# Patient Record
Sex: Male | Born: 1985 | Race: Black or African American | Hispanic: No | Marital: Single
Health system: Southern US, Community
[De-identification: ages and names within clinical notes are randomized; demographics above are authoritative.]

---
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
## 2005-11-10 ENCOUNTER — Emergency Department (HOSPITAL_COMMUNITY): Admission: EM | Admit: 2005-11-10 | Discharge: 2005-11-10 | Payer: Self-pay | Admitting: Emergency Medicine

## 2006-06-27 ENCOUNTER — Emergency Department (HOSPITAL_COMMUNITY): Admission: EM | Admit: 2006-06-27 | Discharge: 2006-06-27 | Payer: Self-pay | Admitting: Emergency Medicine

## 2006-08-20 ENCOUNTER — Emergency Department (HOSPITAL_COMMUNITY): Admission: EM | Admit: 2006-08-20 | Discharge: 2006-08-20 | Payer: Self-pay | Admitting: Emergency Medicine

## 2008-10-15 ENCOUNTER — Emergency Department (HOSPITAL_COMMUNITY): Admission: EM | Admit: 2008-10-15 | Discharge: 2008-10-15 | Payer: Self-pay | Admitting: Emergency Medicine

## 2009-05-06 ENCOUNTER — Emergency Department (HOSPITAL_COMMUNITY): Admission: EM | Admit: 2009-05-06 | Discharge: 2009-05-07 | Payer: Self-pay | Admitting: Emergency Medicine

## 2010-09-30 LAB — CBC
HCT: 45.1 % (ref 39.0–52.0)
Platelets: 164 10*3/uL (ref 150–400)
RDW: 13.6 % (ref 11.5–15.5)

## 2010-09-30 LAB — POCT CARDIAC MARKERS
CKMB, poc: 1.2 ng/mL (ref 1.0–8.0)
Myoglobin, poc: 158 ng/mL (ref 12–200)

## 2010-12-31 ENCOUNTER — Inpatient Hospital Stay (INDEPENDENT_AMBULATORY_CARE_PROVIDER_SITE_OTHER)
Admission: RE | Admit: 2010-12-31 | Discharge: 2010-12-31 | Disposition: A | Discharge status: Home / Self Care | Payer: BC Managed Care – PPO | Source: Ambulatory Visit | Attending: Family Medicine | Admitting: Family Medicine

## 2010-12-31 DIAGNOSIS — T6391XA Toxic effect of contact with unspecified venomous animal, accidental (unintentional), initial encounter: Secondary | ICD-10-CM

## 2011-04-24 ENCOUNTER — Emergency Department (HOSPITAL_COMMUNITY)
Admission: EM | Admit: 2011-04-24 | Discharge: 2011-04-24 | Disposition: A | Discharge status: Home / Self Care | Payer: BC Managed Care – PPO | Attending: Emergency Medicine | Admitting: Emergency Medicine

## 2011-04-24 DIAGNOSIS — R51 Headache: Secondary | ICD-10-CM | POA: Insufficient documentation

## 2011-04-24 DIAGNOSIS — E876 Hypokalemia: Secondary | ICD-10-CM | POA: Insufficient documentation

## 2011-04-24 LAB — DIFFERENTIAL
Basophils Relative: 0 % (ref 0–1)
Monocytes Absolute: 0.3 10*3/uL (ref 0.1–1.0)
Monocytes Relative: 7 % (ref 3–12)
Neutro Abs: 2.3 10*3/uL (ref 1.7–7.7)

## 2011-04-24 LAB — BASIC METABOLIC PANEL
BUN: 11 mg/dL (ref 6–23)
Calcium: 9.4 mg/dL (ref 8.4–10.5)
GFR calc non Af Amer: >90 mL/min (ref >90)
Glucose, Bld: 100 mg/dL — ABNORMAL HIGH (ref 70–99)
Sodium: 137 mEq/L (ref 135–145)

## 2011-04-24 LAB — CBC
Hemoglobin: 16.1 g/dL (ref 13.0–17.0)
MCH: 30.1 pg (ref 26.0–34.0)
MCHC: 36.3 g/dL — ABNORMAL HIGH (ref 30.0–36.0)

## 2019-09-28 ENCOUNTER — Emergency Department (HOSPITAL_COMMUNITY): Payer: BC Managed Care – PPO

## 2019-09-28 ENCOUNTER — Other Ambulatory Visit: Payer: Self-pay

## 2019-09-28 ENCOUNTER — Emergency Department (HOSPITAL_COMMUNITY)
Admission: EM | Admit: 2019-09-28 | Discharge: 2019-09-28 | Disposition: A | Discharge status: Home / Self Care | Payer: BC Managed Care – PPO | Attending: Emergency Medicine | Admitting: Emergency Medicine

## 2019-09-28 DIAGNOSIS — R519 Headache, unspecified: Secondary | ICD-10-CM | POA: Diagnosis present

## 2019-09-28 LAB — I-STAT CHEM 8, ED
BUN: 12 mg/dL (ref 6–20)
Calcium, Ion: 1.19 mmol/L (ref 1.15–1.40)
Chloride: 104 mmol/L (ref 98–111)
Creatinine, Ser: 1.1 mg/dL (ref 0.61–1.24)
Glucose, Bld: 277 mg/dL — ABNORMAL HIGH (ref 70–99)
HCT: 43 % (ref 39.0–52.0)
Hemoglobin: 14.6 g/dL (ref 13.0–17.0)
Potassium: 3.7 mmol/L (ref 3.5–5.1)
Sodium: 134 mmol/L — ABNORMAL LOW (ref 135–145)
TCO2: 26 mmol/L (ref 22–32)

## 2019-09-28 LAB — CBC WITH DIFFERENTIAL/PLATELET
Abs Immature Granulocytes: 0.01 10*3/uL (ref 0.00–0.07)
Basophils Absolute: 0 10*3/uL (ref 0.0–0.1)
Basophils Relative: 0 %
Eosinophils Absolute: 0.1 10*3/uL (ref 0.0–0.5)
Eosinophils Relative: 2 %
HCT: 41.8 % (ref 39.0–52.0)
Hemoglobin: 14.7 g/dL (ref 13.0–17.0)
Immature Granulocytes: 0 %
Lymphocytes Relative: 34 %
Lymphs Abs: 2.1 10*3/uL (ref 0.7–4.0)
MCH: 30.4 pg (ref 26.0–34.0)
MCHC: 35.2 g/dL (ref 30.0–36.0)
MCV: 86.4 fL (ref 80.0–100.0)
Monocytes Absolute: 0.4 10*3/uL (ref 0.1–1.0)
Monocytes Relative: 6 %
Neutro Abs: 3.7 10*3/uL (ref 1.7–7.7)
Neutrophils Relative %: 58 %
Platelets: 153 10*3/uL (ref 150–400)
RBC: 4.84 MIL/uL (ref 4.22–5.81)
RDW: 13.5 % (ref 11.5–15.5)
WBC: 6.4 10*3/uL (ref 4.0–10.5)
nRBC: 0 % (ref 0.0–0.2)

## 2019-09-28 MED ORDER — MAGNESIUM SULFATE IN D5W 1-5 GM/100ML-% IV SOLN
1.0000 g | Freq: Once | INTRAVENOUS | Status: AC
  Administered 2019-09-28: 10:00:00 1 g via INTRAVENOUS
  Filled 2019-09-28: qty 100

## 2019-09-28 MED ORDER — SODIUM CHLORIDE 0.9 % IV BOLUS
1000.0000 mL | Freq: Once | INTRAVENOUS | Status: AC
  Administered 2019-09-28: 07:00:00 1000 mL via INTRAVENOUS

## 2019-09-28 MED ORDER — LORAZEPAM 2 MG/ML IJ SOLN
1.0000 mg | Freq: Once | INTRAMUSCULAR | Status: AC
  Administered 2019-09-28: 13:00:00 1 mg via INTRAVENOUS
  Filled 2019-09-28: qty 1

## 2019-09-28 MED ORDER — KETOROLAC TROMETHAMINE 30 MG/ML IJ SOLN
30.0000 mg | Freq: Once | INTRAMUSCULAR | Status: AC
  Administered 2019-09-28: 07:00:00 30 mg via INTRAVENOUS
  Filled 2019-09-28: qty 1

## 2019-09-28 MED ORDER — METOCLOPRAMIDE HCL 5 MG/ML IJ SOLN
10.0000 mg | Freq: Once | INTRAMUSCULAR | Status: AC
  Administered 2019-09-28: 10 mg via INTRAVENOUS
  Filled 2019-09-28: qty 2

## 2019-09-28 MED ORDER — DROPERIDOL 2.5 MG/ML IJ SOLN
2.5000 mg | Freq: Once | INTRAMUSCULAR | Status: AC
  Administered 2019-09-28: 2.5 mg via INTRAVENOUS
  Filled 2019-09-28: qty 2

## 2019-09-28 MED ORDER — DIPHENHYDRAMINE HCL 50 MG/ML IJ SOLN
25.0000 mg | Freq: Once | INTRAMUSCULAR | Status: AC
  Administered 2019-09-28: 07:00:00 25 mg via INTRAVENOUS
  Filled 2019-09-28: qty 1

## 2019-09-28 MED ORDER — GADOBUTROL 1 MMOL/ML IV SOLN
10.0000 mL | Freq: Once | INTRAVENOUS | Status: AC | PRN
  Administered 2019-09-28: 14:00:00 10 mL via INTRAVENOUS

## 2019-09-28 MED ORDER — MAGNESIUM SULFATE 50 % IJ SOLN: 1.0000 g | Freq: Once | INTRAMUSCULAR | Status: DC

## 2019-09-28 NOTE — ED Notes (Signed)
Patient being transported to MRI

## 2019-09-28 NOTE — Discharge Instructions (Signed)
You have received multiple doses of medications for your headache.  An MRI of your brain was obtained today.  NO acute changes however there are some non specific changes noted to your pituitary in your brain.  This is likely an incidental finding, but please follow up with your doctor for further evaluation.  Follow up with neurologist for further management of your recurrent headache.  Return if you have any concerns.

## 2019-09-28 NOTE — ED Triage Notes (Signed)
Patient arrived with complaints of migraines. Per chart seen recently for same. Patient holding his head and crying staing he is unable to answer any questions at this time.

## 2019-09-28 NOTE — ED Provider Notes (Signed)
[facility] Provider Note   CSN: [id number] Arrival date & time: 09/28/19  0559     History Chief Complaint  Patient presents with  . Migraine    Jason Delacruz is a 34 y.o. male.  The history is provided by the patient and medical records. No language interpreter was used.  Migraine     34 year old male presenting for evaluation of headache.  Patient report recurrent headache ongoing for the past 3 weeks.  Headache is described as a very uncomfortable sensation primarily to the right side behind his right eye, waxing waning but never fully resolved.  Recently he also endorsed nausea without vomiting.  Rates headache as 6 out of 10 however states that it was pretty intense earlier.  Does not complain of any fever chills no runny nose sneezing or coughing no neck stiffness noted focal numbness or weakness no rash.  Also reported he was seen at an ER for this headache recently and had a head CT scan that was unremarkable.  He was recommended to follow-up with a neurologist but his next upcoming appointment is in 3 weeks.  He has been using heat, ice, Tylenol, ibuprofen, and Fioricet at home without adequate relief.  He denies tobacco or alcohol abuse.  He denies any recent medication changes.  He denies any recent Covid infection.  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  He mention haven't had a headache in 2 years.  No past medical history on file.  There are no problems to display for this patient.   The histories are not reviewed yet. Please review them in the "History" navigator section and refresh this SmartLink.     No family history on file.  Social History   Tobacco Use  . Smoking status: Not on file  Substance Use Topics  . Alcohol use: Not on file  . Drug use: Not on file    Home Medications Prior to Admission medications   Not on File     Allergies    Patient has no allergy information on record.  Review of Systems   Review of Systems  All other systems reviewed and are negative.   Physical Exam Updated Vital Signs BP 128/80 (BP Location: Right Arm)   Pulse 90   Temp 97.9 F (36.6 C) (Oral)   Resp (!) 22 Comment: Pt crying  SpO2 99%   Physical Exam Vitals and nursing note reviewed.  Constitutional:      General: He is not in acute distress.    Appearance: He is well-developed.     Comments: Patient lying in bed with his eyes closed however nontoxic in appearance.  HENT:     Head: Normocephalic and atraumatic.     Mouth/Throat:     Mouth: Mucous membranes are dry.  Eyes:     Conjunctiva/sclera: Conjunctivae normal.  Neck:     Comments: No nuchal rigidity Cardiovascular:     Rate and Rhythm: Normal rate and regular rhythm.     Pulses: Normal pulses.     Heart sounds: Normal heart sounds.  Pulmonary:     Effort: Pulmonary effort is normal.     Breath sounds: Normal breath sounds.  Abdominal:     Palpations: Abdomen is soft.     Tenderness: There is no abdominal tenderness.  Musculoskeletal:     Cervical back: Neck supple.     Comments: 5 out of  5 strength to all 4 extremities  Skin:    Findings: No rash.  Neurological:     Mental Status: He is alert and oriented to person, place, and time.  Psychiatric:        Mood and Affect: Mood normal.     ED Results / Procedures / Treatments   Labs (all labs ordered are listed, but only abnormal results are displayed) Labs Reviewed  I-STAT CHEM 8, ED - Abnormal; Notable for the following components:      Result Value   Sodium 134 (*)    Glucose, Bld 277 (*)    All other components within normal limits  CBC WITH DIFFERENTIAL/PLATELET    EKG EKG Interpretation  Date/Time:  Friday September 28 2019 09:21:22 EDT Ventricular Rate:  76 PR Interval:    QRS Duration: 83 QT Interval:  376 QTC Calculation: 423 R Axis:   71 Text Interpretation: Sinus  rhythm Prolonged PR interval ST elevation, consider early repolarization, pericarditis, or injury No significant change since last tracing Confirmed by Linwood Dibbles [id number]) on 09/28/2019 10:05:27 AM   Radiology MR BRAIN W WO CONTRAST  Result Date: 09/28/2019 CLINICAL DATA:  Headache. EXAM: MRI HEAD WITHOUT AND WITH CONTRAST TECHNIQUE: Multiplanar, multiecho pulse sequences of the brain and surrounding structures were obtained without and with intravenous contrast. CONTRAST:  34mL GADAVIST GADOBUTROL 1 MMOL/ML IV SOLN COMPARISON:  None. FINDINGS: Brain: No acute infarction, hemorrhage, hydrocephalus, extra-axial collection or mass lesion. There is a 2 mm T1 hypointense, T2 hyperintense nonenhancing lesion within the pituitary gland correlation with dedicated study of the pituitary suggested. The brain parenchyma itself has normal morphology and signal characteristics without evidence of abnormal contrast enhancement. Vascular: Normal flow voids. Skull and upper cervical spine: Normal marrow signal. Sinuses/Orbits: Mild mucosal thickening of the bilateral ethmoid cells and frontal sinuses. Other: None. IMPRESSION: 1. A 2 mm T1 hypointense, T2 hyperintense nonenhancing lesion within the pituitary gland. Correlation with dedicated study of the pituitary suggested. 2. Otherwise unremarkable MRI of the brain. 3. Mild paranasal sinus disease. Electronically Signed   By: Baldemar Lenis M.D.   On: 09/28/2019 14:17    Procedures Procedures (including critical care time)  Medications Ordered in ED Medications  ketorolac (TORADOL) 30 MG/ML injection 30 mg (30 mg Intravenous Given 09/28/19 0722)  diphenhydrAMINE (BENADRYL) injection 25 mg (25 mg Intravenous Given 09/28/19 0721)  metoCLOPramide (REGLAN) injection 10 mg (10 mg Intravenous Given 09/28/19 0722)  sodium chloride 0.9 % bolus 1,000 mL (0 mLs Intravenous Stopped 09/28/19 0924)  droperidol (INAPSINE) 2.5 MG/ML injection 2.5 mg (2.5 mg Intravenous  Given 09/28/19 0916)  magnesium sulfate IVPB 1 g 100 mL (0 g Intravenous Stopped 09/28/19 1042)  LORazepam (ATIVAN) injection 1 mg (1 mg Intravenous Given 09/28/19 1321)  gadobutrol (GADAVIST) 1 MMOL/ML injection 10 mL (10 mLs Intravenous Contrast Given 09/28/19 1349)    ED Course  I have reviewed the triage vital signs and the nursing notes.  Pertinent labs & imaging results that were available during my care of the patient were reviewed by me and considered in my medical decision making (see chart for details).    MDM Rules/Calculators/A&P                      BP 121/66   Pulse 79   Temp 97.9 F (36.6 C) (Oral)   Resp 14   SpO2 94%   Final Clinical Impression(s) / ED Diagnoses Final diagnoses:  Bad headache  Rx / DC Orders ED Discharge Orders    None     7:05 AM Patient report persistent headache ongoing for the past 3 weeks.  No red flags.  Was seen at an outside ER approximately less than a week ago for the same headache, had a negative head CT scan.  At this time I have low suspicion for any red flags causing severe headache.  Will provide migraine cocktail.  9:15 AM Patient received migraine cocktail which did provide some improvement however headache returns and when I went to check on patient, he appears to be very uncomfortable.  Will provide additional management which include magnesium, and droperidol.  I have low suspicion for subarachnoid hemorrhage, stroke, or meningitis causing his persistent headache.  9:34 AM EKG was obtained to check for QT interval prior to giving droperidol.  Normal QT however there is some ST elevations to V2.  However, patient is without any chest pain to suggest ACS.  2:59 PM I have received another dose of Ativan and prior to brain MRI.  At this time he is resting comfortably sleeping and protecting his airway.  Brain MRI demonstrates a 2 mm lesion within the pituitary gland.  Correlation with dedicated study of pituitary is suggested  otherwise unremarkable MRI  I discussed finding with pt and recommend outpt f/u for addition evaluation of his pituitary.  I do not think the finding is relating to pt's headache.  He denies any vision changes.  Care discussed with Dr. Tomi Bamberger.    Domenic Moras, PA-C 09/28/19 [place], April, MD 10/07/19 [phone]

## 2019-09-28 NOTE — ED Notes (Signed)
RN went to discharge patient and patient is still heavily sedated from migraine medications. Patient drove himself here and no one can come pick him up. RN will discharge patient when he is alert enough to safely drive himself home.

## 2019-09-28 NOTE — ED Notes (Signed)
Writer asked pt for height/weight but pt does not answer. Pt sitting on bed with hands on head, rocking back and forth crying. Pt able to assist himself out of the wheelchair and onto the bed. Pt ambulatory into ED.

## 2019-09-28 NOTE — ED Notes (Signed)
This nurse verified with patient he was prescribed Fioricet when seen on 09/24/2019, patient states it caused the pain to worsen therefore no longer taking it.

## 2021-10-23 IMAGING — MR MR HEAD WO/W CM
11 of 13 series · 33 of 48 positions shown · IV contrast (gadavist)
Comparison: None.

CLINICAL DATA: Headache.

EXAM:
MRI HEAD WITHOUT AND WITH CONTRAST
TECHNIQUE: Multiplanar, multiecho pulse sequences of the brain and surrounding
structures were obtained without and with intravenous contrast.
CONTRAST:  10mL GADAVIST GADOBUTROL 1 MMOL/ML IV SOLN

[Series 5: T2 · sagittal · 5.0mm · 0.47mm/px · 3 of 24 slices shown (1 of 2)]
[im 1/24]
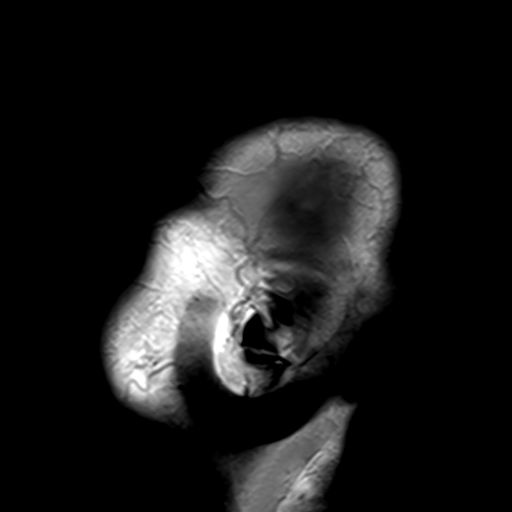
[im 12/24]
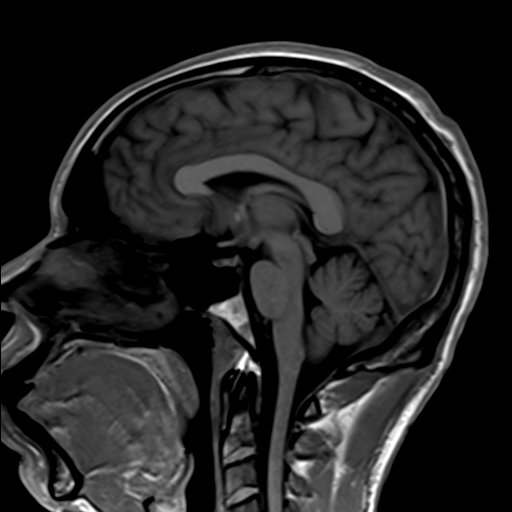
[im 24/24]
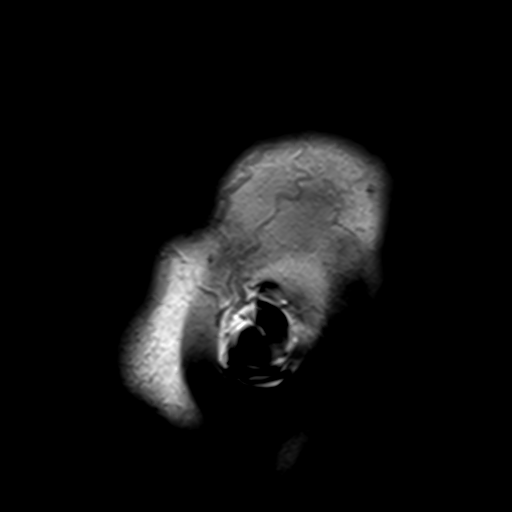

[Series 6: T2 · axial · 4.0mm · 0.45mm/px · z∈[-71,+100]mm · 3 of 34 slices shown (2 of 2)]
[im 1/34]
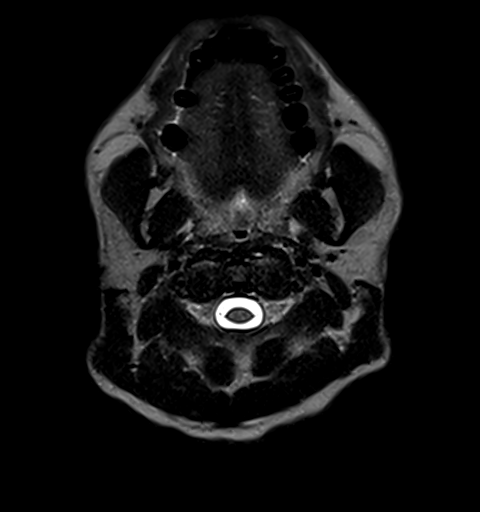
[im 17/34]
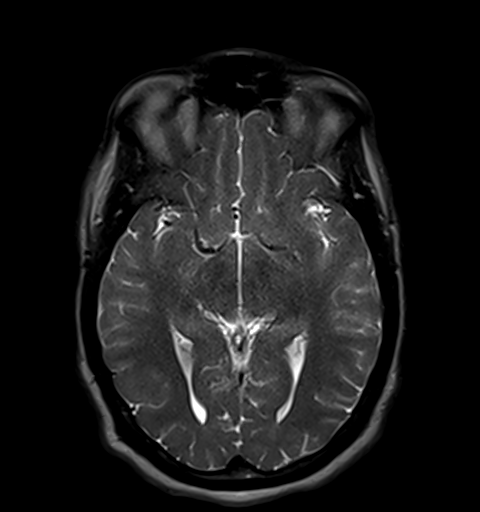
[im 34/34]
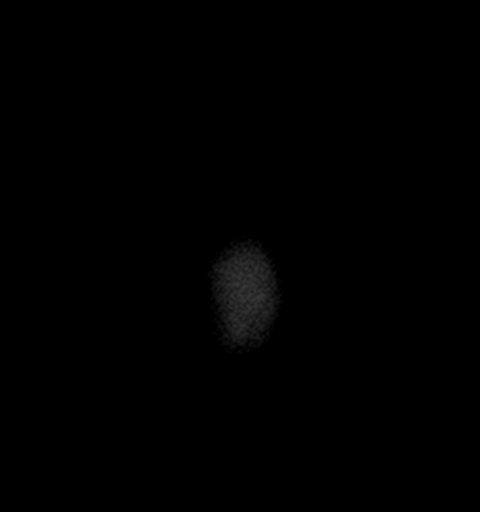

[Series 9: GRE · axial · 3.0mm · 0.45mm/px · z∈[-56,+99]mm · 4 of 53 slices shown]
[im 1/53]
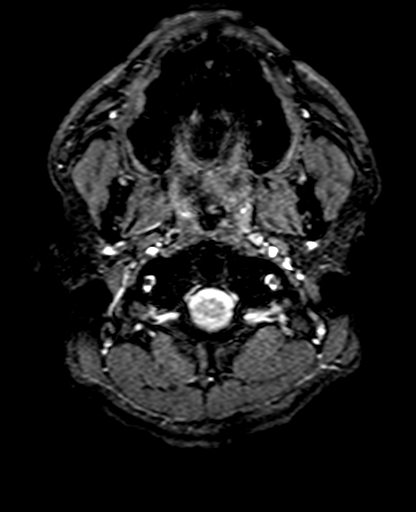
[im 18/53]
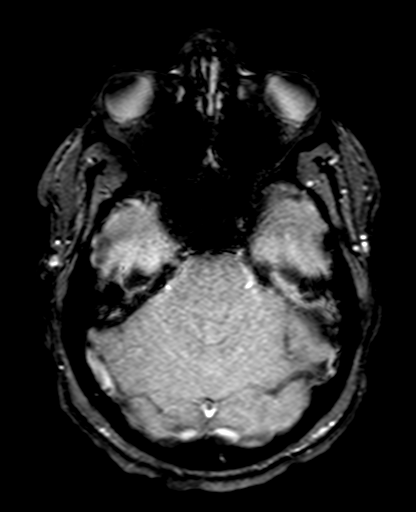
[im 35/53]
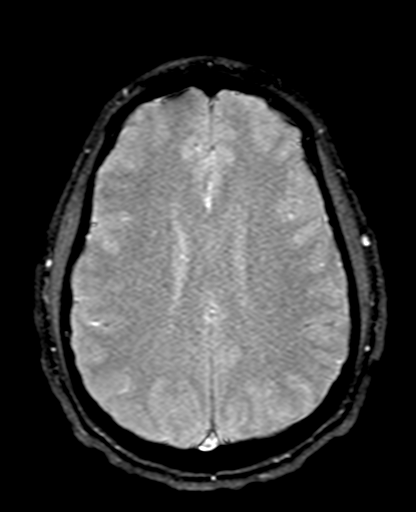
[im 53/53]
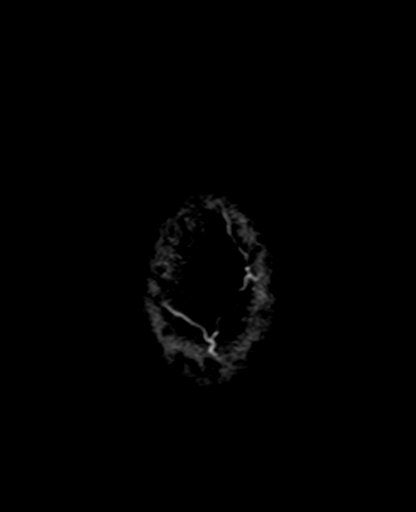

[Series 10: FLAIR · axial · 3.0mm · 0.86mm/px · z∈[-64,+94]mm · 4 of 54 slices shown]
[im 1/54]
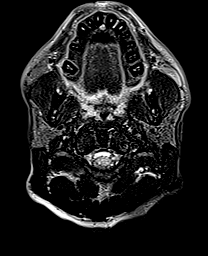
[im 18/54]
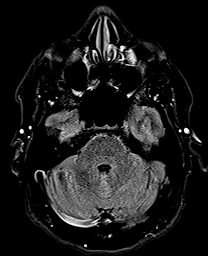
[im 36/54]
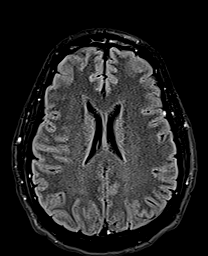
[im 54/54]
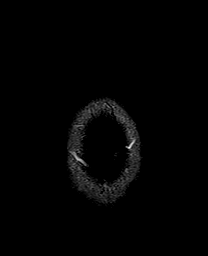

[Series 11: T1 · axial · 3.0mm · 0.45mm/px · z∈[-56,-5]mm · 2 of 53 slices shown]
[im 1/53]
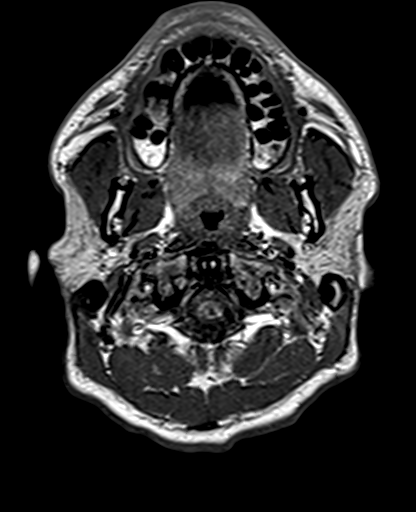
[im 18/53]
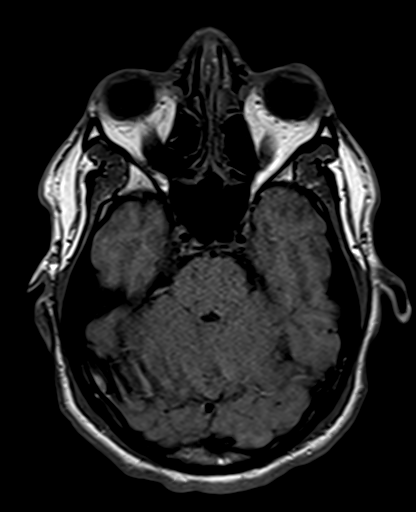

[Series 12: DWI · coronal · 5.0mm · 1.31mm/px · 5 of 56 slices shown (1 of 2)]
[im 1/56]
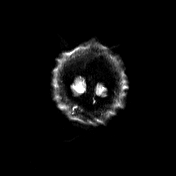
[im 14/56]
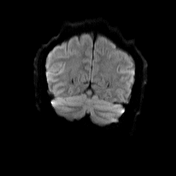
[im 28/56]
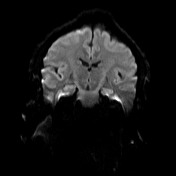
[im 42/56]
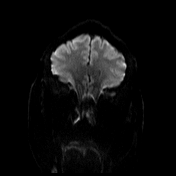
[im 56/56]
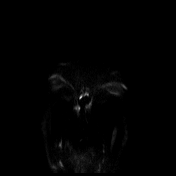

[Series 13: DWI · coronal · 5.0mm · 1.31mm/px · 2 of 28 slices shown (2 of 2)]
[im 1/28]
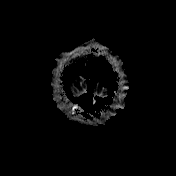
[im 28/28]
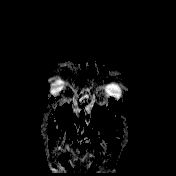

[Series 14: T2 post-contrast · coronal · 5.0mm · 0.86mm/px · 2 of 28 slices shown]
[im 1/28]
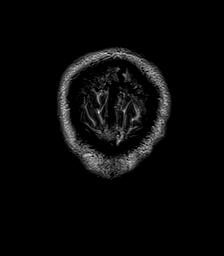
[im 28/28]
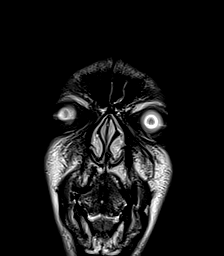

[Series 15: T1 post-contrast · axial · 3.0mm · 0.45mm/px · z∈[-49,+107]mm · 4 of 53 slices shown (1 of 3)]
[im 1/53]
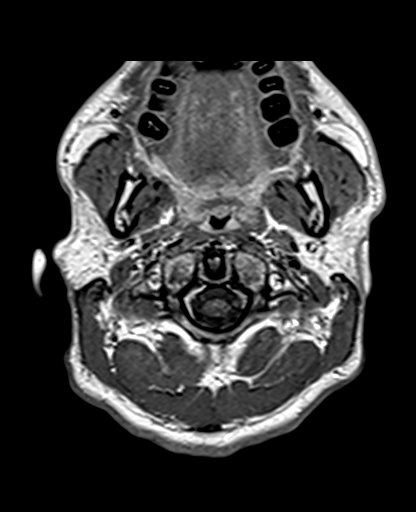
[im 18/53]
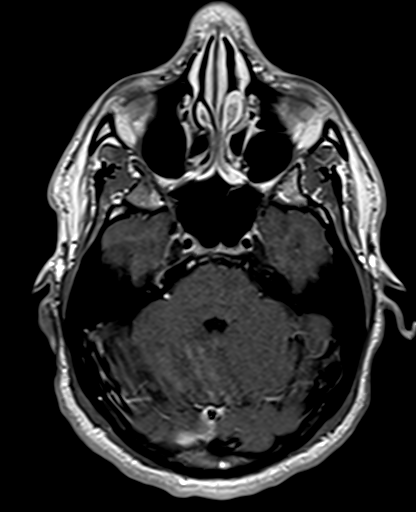
[im 35/53]
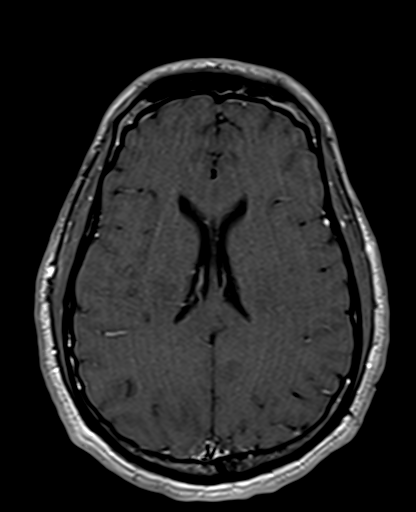
[im 53/53]
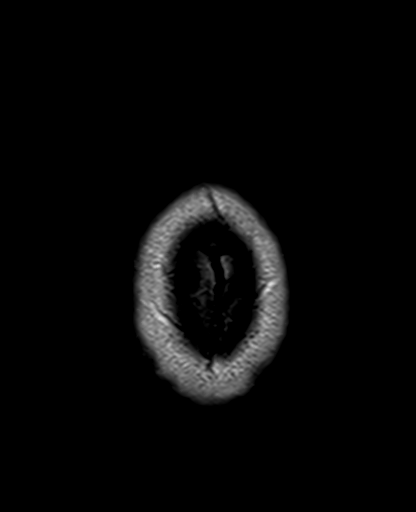

[Series 16: T1 post-contrast · coronal · 5.0mm · 0.43mm/px · 2 of 28 slices shown (2 of 3)]
[im 1/28]
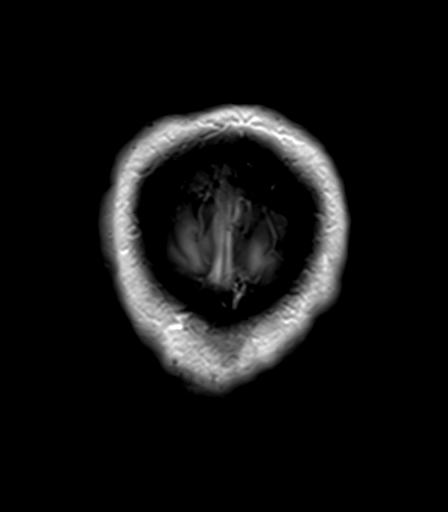
[im 28/28]
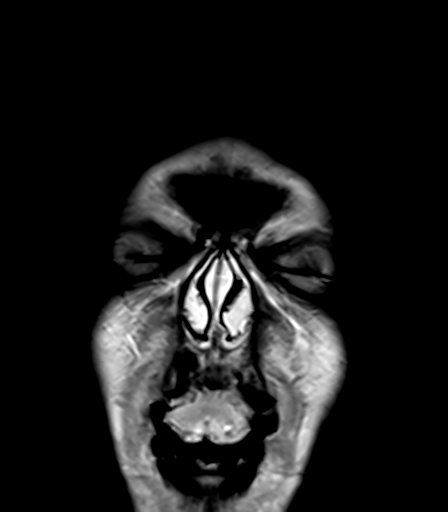

[Series 17: T1 post-contrast · sagittal · 5.0mm · 0.94mm/px · 2 of 24 slices shown (3 of 3)]
[im 1/24]
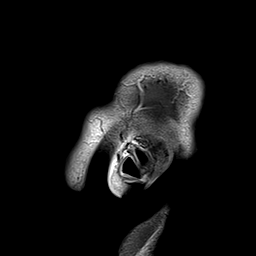
[im 24/24]
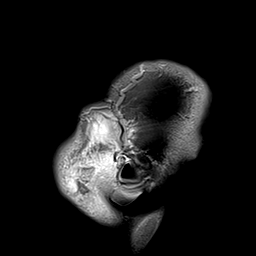

[33 of 48 positions shown; findings below may reference images not displayed]

FINDINGS: Brain: No acute infarction, hemorrhage, hydrocephalus, extra-axial
collection or mass lesion.

There is a 2 mm T1 hypointense, T2 hyperintense nonenhancing lesion
within the pituitary gland correlation with dedicated study of the
pituitary suggested.

The brain parenchyma itself has normal morphology and signal
characteristics without evidence of abnormal contrast enhancement.

Vascular: Normal flow voids.

Skull and upper cervical spine: Normal marrow signal.

Sinuses/Orbits: Mild mucosal thickening of the bilateral ethmoid
cells and frontal sinuses.

Other: None.
IMPRESSION: 1. A 2 mm T1 hypointense, T2 hyperintense nonenhancing lesion within
the pituitary gland. Correlation with dedicated study of the
pituitary suggested.
2. Otherwise unremarkable MRI of the brain.
3. Mild paranasal sinus disease.
